# Patient Record
Sex: Female | Born: 2005 | Race: Black or African American | Hispanic: No | Marital: Single | Smoking: Never smoker
Health system: Southern US, Community
[De-identification: ages and names within clinical notes are randomized; demographics above are authoritative.]

## PROBLEM LIST (undated history)

## (undated) DIAGNOSIS — R51 Headache: Secondary | ICD-10-CM

## (undated) DIAGNOSIS — R519 Headache, unspecified: Secondary | ICD-10-CM

## (undated) HISTORY — DX: Headache, unspecified: R51.9

## (undated) HISTORY — DX: Headache: R51

---
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
## 2023-01-08 DIAGNOSIS — Z3041 Encounter for surveillance of contraceptive pills: Secondary | ICD-10-CM | POA: Diagnosis not present

## 2023-01-08 DIAGNOSIS — N921 Excessive and frequent menstruation with irregular cycle: Secondary | ICD-10-CM | POA: Diagnosis not present

## 2023-08-16 DIAGNOSIS — H6691 Otitis media, unspecified, right ear: Secondary | ICD-10-CM | POA: Diagnosis not present

## 2023-08-16 DIAGNOSIS — J029 Acute pharyngitis, unspecified: Secondary | ICD-10-CM | POA: Diagnosis not present

## 2023-08-16 DIAGNOSIS — R059 Cough, unspecified: Secondary | ICD-10-CM | POA: Diagnosis not present

## 2023-08-16 DIAGNOSIS — Z20822 Contact with and (suspected) exposure to covid-19: Secondary | ICD-10-CM | POA: Diagnosis not present

## 2023-09-17 DIAGNOSIS — Z0000 Encounter for general adult medical examination without abnormal findings: Secondary | ICD-10-CM | POA: Diagnosis not present

## 2023-09-17 DIAGNOSIS — Z1239 Encounter for other screening for malignant neoplasm of breast: Secondary | ICD-10-CM | POA: Diagnosis not present
